# Patient Record
Sex: Male | Born: 1980 | Race: White | Hispanic: No | Smoking: Never smoker
Health system: Southern US, Community
[De-identification: ages and names within clinical notes are randomized; demographics above are authoritative.]

## PROBLEM LIST (undated history)

## (undated) DIAGNOSIS — G8929 Other chronic pain: Secondary | ICD-10-CM

## (undated) DIAGNOSIS — R51 Headache: Secondary | ICD-10-CM

## (undated) HISTORY — PX: ANTERIOR CRUCIATE LIGAMENT REPAIR: SHX115

## (undated) HISTORY — PX: OTHER SURGICAL HISTORY: SHX169

---

## 2010-10-12 ENCOUNTER — Ambulatory Visit (HOSPITAL_COMMUNITY)
Admission: RE | Admit: 2010-10-12 | Discharge: 2010-10-13 | Payer: Self-pay | Source: Home / Self Care | Admitting: Neurosurgery

## 2011-01-24 LAB — CBC
MCH: 28.5 pg (ref 26.0–34.0)
MCV: 83.2 fL (ref 78.0–100.0)
Platelets: 233 10*3/uL (ref 150–400)
RBC: 5.29 MIL/uL (ref 4.22–5.81)
RDW: 12.6 % (ref 11.5–15.5)
WBC: 6.8 10*3/uL (ref 4.0–10.5)

## 2011-01-24 LAB — SURGICAL PCR SCREEN: Staphylococcus aureus: POSITIVE — AB

## 2011-05-17 IMAGING — CR DG LUMBAR SPINE 2-3V
1 series · 1 of 1 positions shown · non-contrast
Comparison: None.

CLINICAL DATA: 28-year-old male undergoing lumbar spine surgery.

LUMBAR SPINE - 2-3 VIEW

[view not recorded]
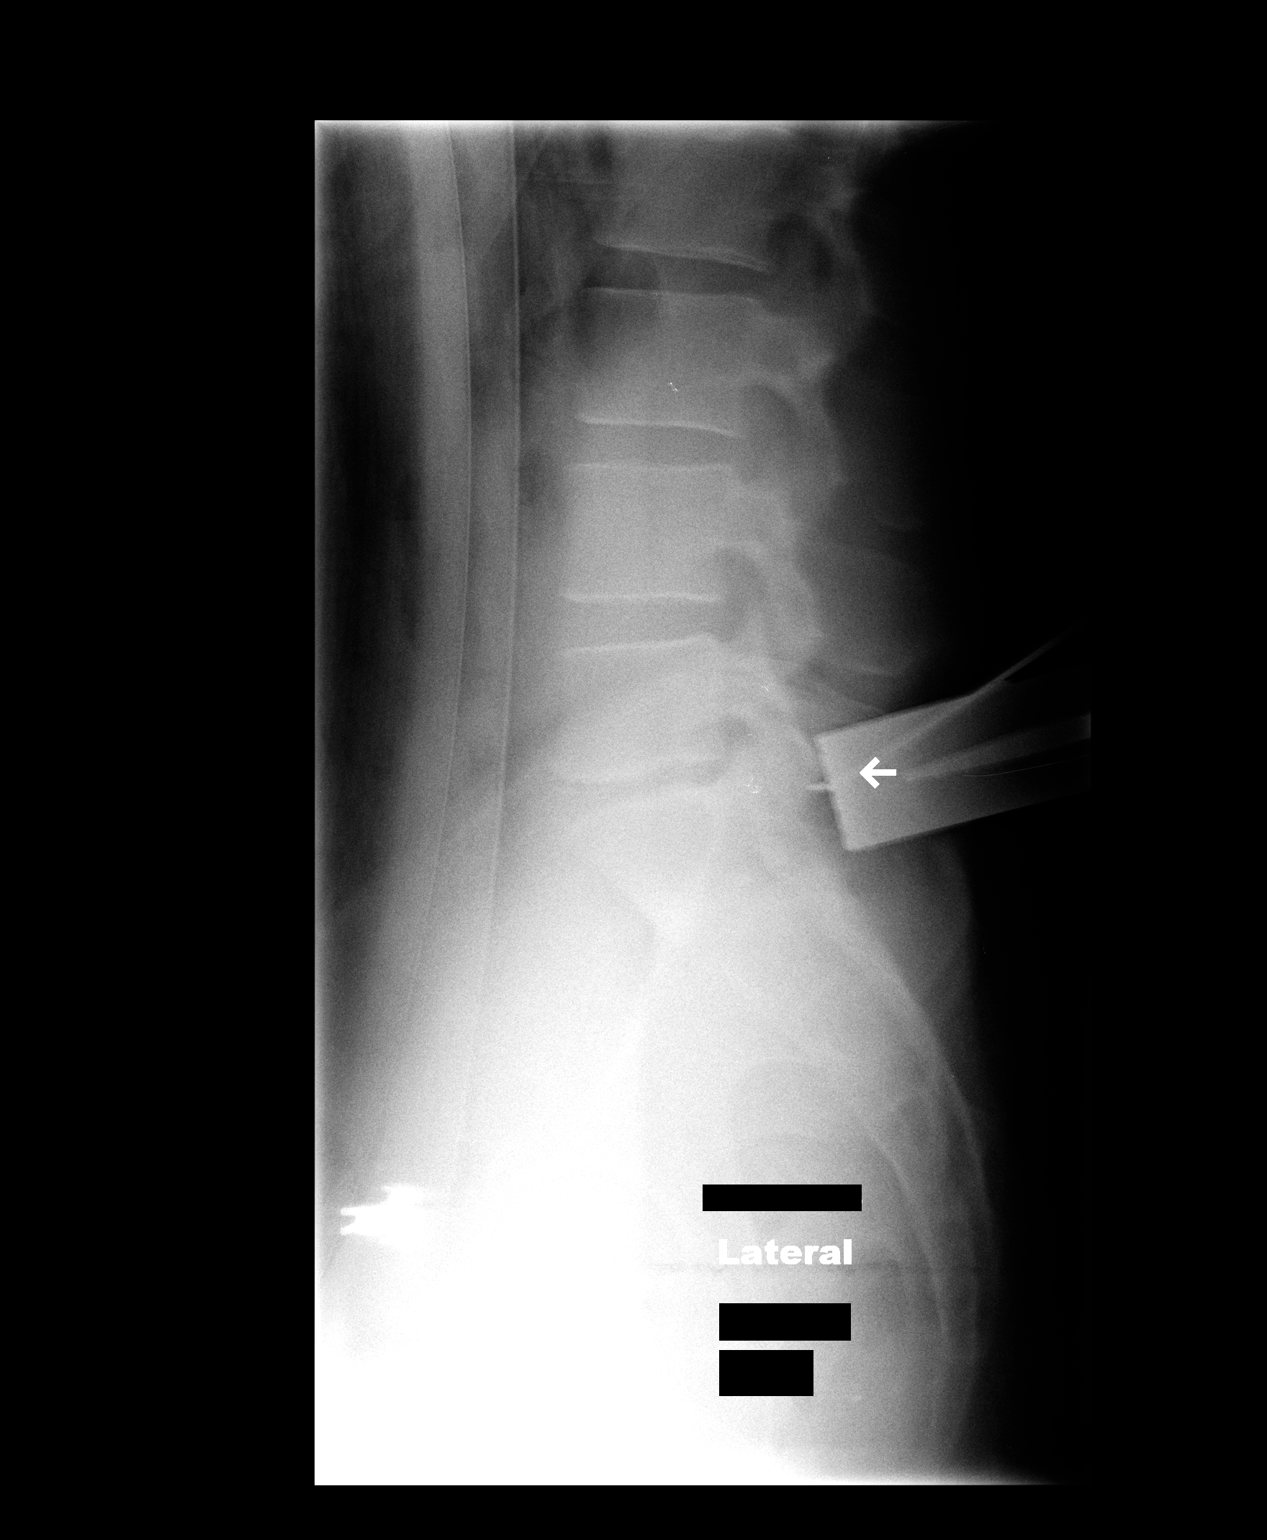

[1 of 1 positions shown; findings below may reference images not displayed]

FINDINGS: Normal lumbar segmentation is assumed on these images.

2 intraoperative portable cross-table lateral views of lumbar
spine.

Film #1 8388 hours.  Posterior spinal needle or surgical probe is
directed at the lower S1 level.

Film #2 at 6076 hours.  A surgical probe is directed toward the L5-
S1 disc space, projecting adjacent to the inferior L5 articulating
facet.
IMPRESSION: Intraoperative localization as above.

## 2014-12-28 ENCOUNTER — Emergency Department (HOSPITAL_BASED_OUTPATIENT_CLINIC_OR_DEPARTMENT_OTHER)
Admission: EM | Admit: 2014-12-28 | Discharge: 2014-12-28 | Disposition: A | Discharge status: Home / Self Care | Payer: Managed Care, Other (non HMO) | Attending: Emergency Medicine | Admitting: Emergency Medicine

## 2014-12-28 ENCOUNTER — Emergency Department (HOSPITAL_BASED_OUTPATIENT_CLINIC_OR_DEPARTMENT_OTHER): Payer: Managed Care, Other (non HMO)

## 2014-12-28 ENCOUNTER — Encounter (HOSPITAL_BASED_OUTPATIENT_CLINIC_OR_DEPARTMENT_OTHER): Payer: Self-pay | Admitting: *Deleted

## 2014-12-28 DIAGNOSIS — N492 Inflammatory disorders of scrotum: Secondary | ICD-10-CM | POA: Insufficient documentation

## 2014-12-28 MED ORDER — CLINDAMYCIN HCL 150 MG PO CAPS: 300.0000 mg | ORAL_CAPSULE | Freq: Three times a day (TID) | ORAL | Status: AC

## 2014-12-28 MED ORDER — CLINDAMYCIN PHOSPHATE 600 MG/50ML IV SOLN
600.0000 mg | Freq: Once | INTRAVENOUS | Status: AC
  Administered 2014-12-28: 600 mg via INTRAVENOUS
  Filled 2014-12-28: qty 50

## 2014-12-28 MED ORDER — HYDROMORPHONE HCL 1 MG/ML IJ SOLN
1.0000 mg | Freq: Once | INTRAMUSCULAR | Status: AC
  Administered 2014-12-28: 1 mg via INTRAVENOUS
  Filled 2014-12-28: qty 1

## 2014-12-28 MED ORDER — HYDROCODONE-ACETAMINOPHEN 10-325 MG PO TABS: 1.0000 | ORAL_TABLET | Freq: Four times a day (QID) | ORAL | Status: DC | PRN

## 2014-12-28 NOTE — ED Notes (Signed)
Pt. Reports on Sunday he felt pain and Saw Drainage from the Scrotum on yesterday.  Pt. Reports he has had some bleeding today in the area with lots of swelling and tenderness in the area of the lower scrotal sac at the cellulitis.  [facility]

## 2014-12-28 NOTE — ED Provider Notes (Signed)
CSN: [id number]     Arrival date & time 12/28/14  1943 History  This chart was scribed for Jeffrey Munchobert Febe Champa, MD by Freida Busmaniana Omoyeni, ED Scribe. This patient was seen in room MH03/MH03 and the patient's care was started 9:35 PM.    Chief Complaint  Patient presents with  . Cellulitis    The history is provided by the patient. No language interpreter was used.     HPI Comments:  Jeffrey Vaughn is a 34 y.o. male who presents to the Emergency Department complaining of mild-moderate testicular pain and swelling which started 3 days ago. He reports associated testicular discharge yesterday and blood from the site this am. He also reports subjective fever; states he took huis temp 2 days ago and it was 99.5 but did not measure his temp yesterday. [facility] for his symptoms and advised to come to the ED to have an ultrasound. He denies vomiting, confusion, and disorientation. No alleviating factors noted.   History reviewed. No pertinent past medical history. History reviewed. No pertinent past surgical history. No family history on file. History  Substance Use Topics  . Smoking status: Never Smoker   . Smokeless tobacco: Not on file  . Alcohol Use: No    Review of Systems  Constitutional: Positive for fever.  Gastrointestinal: Negative for nausea and vomiting.  Genitourinary: Positive for scrotal swelling and testicular pain.  Psychiatric/Behavioral: Negative for confusion.  All other systems reviewed and are negative.     Allergies  Review of patient's allergies indicates no known allergies.  Home Medications   Prior to Admission medications   Not on File   BP 112/97 mmHg  Pulse 118  Temp(Src) 98.5 F (36.9 C) (Oral)  Resp 20  Ht 6\' 1"  (1.854 m)  Wt 224 lb (101.606 kg)  BMI 29.56 kg/m2  SpO2 99% Physical Exam  Constitutional: He is oriented to person, place, and time. He appears well-developed. No distress.  HENT:  Head: Normocephalic  and atraumatic.  Eyes: Conjunctivae and EOM are normal.  Cardiovascular: Normal rate and regular rhythm.   Pulmonary/Chest: Effort normal. No stridor. No respiratory distress.  Abdominal: He exhibits no distension.  Genitourinary: Penis normal. Circumcised.  Firmness right hemi scrotum  Palpable lesion 1 cm mid scrotum. No active bleeding or drainage.  No pain w palp of either testes  Chaperone (scribe) was present for exam which was performed with no discomfort or complications.    Musculoskeletal: He exhibits no edema.  Neurological: He is alert and oriented to person, place, and time.  Skin: Skin is warm and dry.  Psychiatric: He has a normal mood and affect.  Nursing note and vitals reviewed.   ED Course  Procedures   DIAGNOSTIC STUDIES:  Oxygen Saturation is 99% on RA, normal by my interpretation.    COORDINATION OF CARE:  9:41 PM Will order scrotal US and pain meds. Discussed treatment plan with pt at bedside and pt agreed to plan.   Imaging Review [place] Scrotum  12/28/2014   CLINICAL DATA:  Cellulitis and mass in the posterior mid scrotum.  EXAM: ULTRASOUND OF SCROTUM  TECHNIQUE: Complete ultrasound examination of the testicles, epididymis, and other scrotal structures was performed.  COMPARISON:  None.  FINDINGS: Right testicle  Measurements: 2.0 x 2.6 x 4.3 cm. No mass or microlithiasis visualized.  Left testicle  Measurements: 2.1 x 2.5 x 4.3 cm. No mass or microlithiasis visualized.  Right epididymis:  Normal in size and appearance.  Left epididymis:  Normal in size and appearance.  Hydrocele:  None visualized.  Varicocele:  None visualized.  In the posterior mid scrotum there is focal skin thickening, hyperemic on color Doppler. There is an adjacent 1.4 x 2.7 by 1.4 cm hypoechoic collection which likely represents a small posterior midline scrotal abscess.  IMPRESSION: 1. Normal testes. 2. Posterior scrotal skin thickening and focal collection, likely an abscess measuring  1.4 x 1.4 x 2.7 cm.   Electronically Signed   By: Ellery Plunk M.D.   On: 12/28/2014 23:10    On repeat exam the patient is calm.  I discussed all findings with him and his wife. No new complaints.  They voice understanding of all results.  They will follow up with urology. MDM   Final diagnoses:  Scrotal abscess   patient presents with concern of ongoing scrotal pain. Patient had a scrotal abscess. No evidence for sepsis bacteremia. The lesion is draining intermittently, spontaneously.  Patient was counseled on home care instructions, will follow-up with urology. He received initial and a box in the IV, was transitioned to oral antibiotic, discharged in stable condition.  I personally performed the services described in this documentation, which was scribed in my presence. The recorded information has been reviewed and is accurate.      Jeffrey Munch, MD 12/29/14 Marlyne Beards

## 2014-12-28 NOTE — Discharge Instructions (Signed)
As discussed, you have a scrotal abscess.  It is important that you keep the area clean, dry.  Please take all medication as directed, follow up with our urologist for further evaluation and management.  Return here for concerning changes in your condition.

## 2015-05-18 ENCOUNTER — Encounter (HOSPITAL_BASED_OUTPATIENT_CLINIC_OR_DEPARTMENT_OTHER): Payer: Self-pay | Admitting: Emergency Medicine

## 2015-05-18 ENCOUNTER — Emergency Department (HOSPITAL_BASED_OUTPATIENT_CLINIC_OR_DEPARTMENT_OTHER)
Admission: EM | Admit: 2015-05-18 | Discharge: 2015-05-18 | Disposition: A | Discharge status: Home / Self Care | Payer: Managed Care, Other (non HMO) | Attending: Emergency Medicine | Admitting: Emergency Medicine

## 2015-05-18 ENCOUNTER — Emergency Department (HOSPITAL_BASED_OUTPATIENT_CLINIC_OR_DEPARTMENT_OTHER)
Admission: EM | Admit: 2015-05-18 | Discharge: 2015-05-19 | Disposition: A | Discharge status: Home / Self Care | Payer: Managed Care, Other (non HMO) | Attending: Emergency Medicine | Admitting: Emergency Medicine

## 2015-05-18 DIAGNOSIS — G43909 Migraine, unspecified, not intractable, without status migrainosus: Secondary | ICD-10-CM | POA: Insufficient documentation

## 2015-05-18 DIAGNOSIS — M545 Low back pain: Secondary | ICD-10-CM | POA: Insufficient documentation

## 2015-05-18 DIAGNOSIS — D72829 Elevated white blood cell count, unspecified: Secondary | ICD-10-CM | POA: Insufficient documentation

## 2015-05-18 DIAGNOSIS — N390 Urinary tract infection, site not specified: Secondary | ICD-10-CM | POA: Insufficient documentation

## 2015-05-18 DIAGNOSIS — R509 Fever, unspecified: Secondary | ICD-10-CM | POA: Diagnosis present

## 2015-05-18 DIAGNOSIS — R51 Headache: Secondary | ICD-10-CM | POA: Diagnosis present

## 2015-05-18 DIAGNOSIS — G43009 Migraine without aura, not intractable, without status migrainosus: Secondary | ICD-10-CM

## 2015-05-18 LAB — CBC WITH DIFFERENTIAL/PLATELET
BASOS PCT: 0 % (ref 0–1)
Basophils Absolute: 0 10*3/uL (ref 0.0–0.1)
Basophils Absolute: 0 10*3/uL (ref 0.0–0.1)
Basophils Relative: 0 % (ref 0–1)
EOS ABS: 0 10*3/uL (ref 0.0–0.7)
EOS PCT: 0 % (ref 0–5)
Eosinophils Absolute: 0 10*3/uL (ref 0.0–0.7)
Eosinophils Relative: 0 % (ref 0–5)
HCT: 41.8 % (ref 39.0–52.0)
HCT: 42.9 % (ref 39.0–52.0)
HEMOGLOBIN: 13.8 g/dL (ref 13.0–17.0)
Hemoglobin: 14.3 g/dL (ref 13.0–17.0)
LYMPHS ABS: 0.5 10*3/uL — AB (ref 0.7–4.0)
LYMPHS ABS: 0.5 10*3/uL — AB (ref 0.7–4.0)
LYMPHS PCT: 3 % — AB (ref 12–46)
Lymphocytes Relative: 2 % — ABNORMAL LOW (ref 12–46)
MCH: 27 pg (ref 26.0–34.0)
MCH: 27.2 pg (ref 26.0–34.0)
MCHC: 33 g/dL (ref 30.0–36.0)
MCHC: 33.3 g/dL (ref 30.0–36.0)
MCV: 80.9 fL (ref 78.0–100.0)
MCV: 82.4 fL (ref 78.0–100.0)
MONOS PCT: 4 % (ref 3–12)
Monocytes Absolute: 0.9 10*3/uL (ref 0.1–1.0)
Monocytes Absolute: 1.5 10*3/uL — ABNORMAL HIGH (ref 0.1–1.0)
Monocytes Relative: 6 % (ref 3–12)
NEUTROS PCT: 92 % — AB (ref 43–77)
NEUTROS PCT: 93 % — AB (ref 43–77)
Neutro Abs: 19 10*3/uL — ABNORMAL HIGH (ref 1.7–7.7)
Neutro Abs: 21.6 10*3/uL — ABNORMAL HIGH (ref 1.7–7.7)
PLATELETS: 145 10*3/uL — AB (ref 150–400)
PLATELETS: 132 10*3/uL — AB (ref 150–400)
RBC: 5.3 MIL/uL (ref 4.22–5.81)
RBC: 5.07 MIL/uL (ref 4.22–5.81)
RDW: 15.1 % (ref 11.5–15.5)
RDW: 15.3 % (ref 11.5–15.5)
WBC: 23.6 10*3/uL — ABNORMAL HIGH (ref 4.0–10.5)
WBC: 20.5 10*3/uL — AB (ref 4.0–10.5)

## 2015-05-18 LAB — URINALYSIS, ROUTINE W REFLEX MICROSCOPIC
Bilirubin Urine: NEGATIVE
Glucose, UA: NEGATIVE mg/dL
Ketones, ur: NEGATIVE mg/dL
NITRITE: POSITIVE — AB
PROTEIN: 100 mg/dL — AB
Specific Gravity, Urine: 1.025 (ref 1.005–1.030)
UROBILINOGEN UA: 1 mg/dL (ref 0.0–1.0)
pH: 7.5 (ref 5.0–8.0)

## 2015-05-18 LAB — URINE MICROSCOPIC-ADD ON

## 2015-05-18 LAB — COMPREHENSIVE METABOLIC PANEL
ALK PHOS: 39 U/L (ref 38–126)
ALT: 28 U/L (ref 17–63)
ANION GAP: 11 (ref 5–15)
AST: 21 U/L (ref 15–41)
Albumin: 3.7 g/dL (ref 3.5–5.0)
BILIRUBIN TOTAL: 1.1 mg/dL (ref 0.3–1.2)
BUN: 11 mg/dL (ref 6–20)
CHLORIDE: 102 mmol/L (ref 101–111)
CO2: 24 mmol/L (ref 22–32)
Calcium: 8.8 mg/dL — ABNORMAL LOW (ref 8.9–10.3)
Creatinine, Ser: 1.01 mg/dL (ref 0.61–1.24)
GLUCOSE: 124 mg/dL — AB (ref 65–99)
POTASSIUM: 3.8 mmol/L (ref 3.5–5.1)
SODIUM: 137 mmol/L (ref 135–145)
TOTAL PROTEIN: 6.4 g/dL — AB (ref 6.5–8.1)

## 2015-05-18 LAB — I-STAT CG4 LACTIC ACID, ED
LACTIC ACID, VENOUS: 0.86 mmol/L (ref 0.5–2.0)
Lactic Acid, Venous: 0.76 mmol/L (ref 0.5–2.0)

## 2015-05-18 MED ORDER — CEFTRIAXONE SODIUM 1 G IJ SOLR: 1.0000 g | Freq: Once | INTRAMUSCULAR | Status: AC

## 2015-05-18 MED ORDER — CEPHALEXIN 500 MG PO CAPS: 500.0000 mg | ORAL_CAPSULE | Freq: Four times a day (QID) | ORAL | Status: AC

## 2015-05-18 MED ORDER — KETOROLAC TROMETHAMINE 30 MG/ML IJ SOLN
30.0000 mg | Freq: Once | INTRAMUSCULAR | Status: AC
  Administered 2015-05-18: 30 mg via INTRAVENOUS
  Filled 2015-05-18: qty 1

## 2015-05-18 MED ORDER — HYDROCODONE-ACETAMINOPHEN 5-325 MG PO TABS
1.0000 | ORAL_TABLET | ORAL | Status: AC | PRN
Start: 2015-05-18 — End: ?

## 2015-05-18 MED ORDER — CEFTRIAXONE SODIUM 1 G IJ SOLR
INTRAMUSCULAR | Status: AC
  Administered 2015-05-18: 1000 mg
  Filled 2015-05-18: qty 10

## 2015-05-18 MED ORDER — DIPHENHYDRAMINE HCL 50 MG/ML IJ SOLN
50.0000 mg | Freq: Once | INTRAMUSCULAR | Status: AC
  Administered 2015-05-18: 50 mg via INTRAVENOUS
  Filled 2015-05-18: qty 1

## 2015-05-18 MED ORDER — SODIUM CHLORIDE 0.9 % IV BOLUS (SEPSIS)
1000.0000 mL | Freq: Once | INTRAVENOUS | Status: AC
  Administered 2015-05-18: 1000 mL via INTRAVENOUS

## 2015-05-18 MED ORDER — ACETAMINOPHEN 500 MG PO TABS
1000.0000 mg | ORAL_TABLET | Freq: Once | ORAL | Status: AC
  Administered 2015-05-18: 1000 mg via ORAL
  Filled 2015-05-18: qty 2

## 2015-05-18 MED ORDER — METOCLOPRAMIDE HCL 5 MG/ML IJ SOLN
10.0000 mg | Freq: Once | INTRAMUSCULAR | Status: AC
  Administered 2015-05-18: 10 mg via INTRAVENOUS
  Filled 2015-05-18: qty 2

## 2015-05-18 MED ORDER — ONDANSETRON HCL 4 MG/2ML IJ SOLN
4.0000 mg | Freq: Once | INTRAMUSCULAR | Status: DC
Start: 2015-05-18 — End: 2015-05-18

## 2015-05-18 MED ORDER — IBUPROFEN 600 MG PO TABS: 600.0000 mg | ORAL_TABLET | Freq: Four times a day (QID) | ORAL | Status: AC | PRN

## 2015-05-18 NOTE — ED Provider Notes (Signed)
TIME SEEN: 11:25 PM  CHIEF COMPLAINT: HA and Emesis  HPI:  Jeffrey Vaughn is a 34 y.o. male with history of migraines who presents to the Emergency Department complaining of HA onset today. Pt has a hx of migraines and this current HA feels similar to his migraines. Pt was seen in the ED last night and dx with an UTI, pt has began taking his abx Rx. Pt notes that he has had a HA all day today and that his urinary symptoms have subsided. He states that he is having associated symptoms of vomiting x 2 today. Pt last vomited 2 hours ago. He states that he has tried ibuprofen and hydrocodone with no relief for his symptoms. He denies hematuria, dysuria, fever, cough, rash, and any other symptoms. No abdominal pain or back pain. Pt denies hx of kidney stones or previous kidney infection. States last fever was earlier this morning. Not on anticoagulation. No history of head injury. No numbness, tingling or focal weakness. Headache worse with lights and sounds. Described as moderate to severe, gradual onset. No radiation of pain.   ROS: See HPI Constitutional:  fever  Eyes: no drainage  ENT: no runny nose   Cardiovascular:  no chest pain  Resp: no SOB  GI:  vomiting GU: no dysuria Integumentary: no rash  Allergy: no hives  Musculoskeletal: no leg swelling  Neurological: no slurred speech ROS otherwise negative  PAST MEDICAL HISTORY/PAST SURGICAL HISTORY:  History reviewed. No pertinent past medical history.  MEDICATIONS:  Prior to Admission medications   Medication Sig Start Date End Date Taking? Authorizing Provider  cephALEXin (KEFLEX) 500 MG capsule Take 1 capsule (500 mg total) by mouth 4 (four) times daily. 05/18/15  Yes [name], MD  HYDROcodone-acetaminophen (NORCO) 5-325 MG per tablet Take 1-2 tablets by mouth every 4 (four) hours as needed for moderate pain or severe pain. 05/18/15  Yes [name], MD  ibuprofen (ADVIL,MOTRIN) 600 MG tablet Take 1 tablet (600 mg total) by  mouth every 6 (six) hours as needed. 05/18/15  Yes [name], MD    ALLERGIES:  No Known Allergies  SOCIAL HISTORY:  History  Substance Use Topics  . Smoking status: Never Smoker   . Smokeless tobacco: Not on file  . Alcohol Use: No    FAMILY HISTORY: No family history on file.  EXAM: BP 127/59 mmHg  Pulse 81  Temp(Src) 98.3 F (36.8 C) (Oral)  Resp 18  Ht  (1.854 m)  Wt 200 lb (90.719 kg)  BMI 26.39 kg/m2  SpO2 100% CONSTITUTIONAL: Alert and oriented and responds appropriately to questions. Well-appearing; well-nourished, nontoxic, afebrile HEAD: Normocephalic EYES: Conjunctivae clear, PERRL, photophobia ENT: normal nose; no rhinorrhea; moist mucous membranes; pharynx without lesions noted NECK: Supple, no meningismus, no LAD  CARD: RRR; S1 and S2 appreciated; no murmurs, no clicks, no rubs, no gallops RESP: Normal chest excursion without splinting or tachypnea; breath sounds clear and equal bilaterally; no wheezes, no rhonchi, no rales, no hypoxia or respiratory distress, speaking full sentences ABD/GI: Normal bowel sounds; non-distended; soft, non-tender, no rebound, no guarding, no peritoneal signs BACK:  The back appears normal and is non-tender to palpation, there is no CVA tenderness EXT: Normal ROM in all joints; non-tender to palpation; no edema; normal capillary refill; no cyanosis, no calf tenderness or swelling    SKIN: Normal color for age and race; warm NEURO: Moves all extremities equally, sensation to light touch intact diffusely, cranial nerves II through XII intact PSYCH:  The patient's mood and manner are appropriate. Grooming and personal hygiene are appropriate.  MEDICAL DECISION MAKING: Patient here with migraine headache. He was also here last night for urinary tract infection, possible pyelonephritis. Had a leukocytosis of 23,000 but otherwise labs including lactate were normal. Urine did show nitrite-positive UTI and he was given ceftriaxone  in the ED. Patient reports he was feeling much better when he left yesterday. He was discharged on Keflex which he's been taking. States his last fever was 12 hours ago. States his headache feels like his prior migraines. No new neurologic deficit. No meningismus on exam. Treat headache with Toradol, Reglan, Benadryl and IV fluids. We'll repeat labs, lactate and urine given patient could have pyelonephritis to ensure things are improving and that is not the cause of his vomiting today. He appears well hydrated on exam. Abdominal exam is benign.  ED PROGRESS: Patient's labs are improving. His white blood cell count is now 20,000. His urine shows moderate leukocytes and bacteria but no longer shows nitrites. Lactate is still normal 0.76. He reports his headache is completely gone after migraine cocktail and he is feeling much better. Have given him another dose of ceftriaxone in the emergency department and have recommended he continue his Keflex. He states he would like to be discharged home. He was discharged with ibuprofen, Vicodin as well for pain. We'll discharge him with Zofran for nausea and vomiting. He has not had any vomiting in the emergency department and is tolerating by mouth. Discussed return precautions. He verbalizes understanding is comfortable with this plan.   I personally performed the services described in this documentation, which was scribed in my presence. The recorded information has been reviewed and is accurate.   Layla MawKristen N Miyanna Wiersma, DO 05/19/15 [phone]

## 2015-05-18 NOTE — ED Provider Notes (Signed)
9:26 AM Care assumed from Dr. Ranae PalmsYelverton.  On reassessment, pt nontoxic, vitals improved, feeling somewhat better.  Appears stable for outpatient treatment.  Given abx and return precautions.   Clinical Impression: 1. UTI (lower urinary tract infection)   2. Leukocytosis       Jeffrey DivineJohn Hilda Rynders, MD 05/18/15 [phone]

## 2015-05-18 NOTE — ED Notes (Signed)
Pt here for a recheck of headache and vomiting symptoms. Pt recently d/c from here last night with dx of UTI and has started Antibiotics. 2 episodes of vomiting today.

## 2015-05-18 NOTE — ED Notes (Signed)
Patient reports that he has had fever and burning with urination x 24 hours.

## 2015-05-18 NOTE — ED Notes (Signed)
MD at bedside. 

## 2015-05-18 NOTE — ED Provider Notes (Signed)
CSN: [id number]     Arrival date & time 05/18/15  0423 History   First MD Initiated Contact with Patient 05/18/15 [phone]     Chief Complaint  Patient presents with  . Fever     (Consider location/radiation/quality/duration/timing/severity/associated sxs/prior Treatment) HPI Patient with 24 hours of fever. He states he's also had chills. He's been taking Advil with some relief. Patient has had burning with urination and frequent urination over the last 24 hours as well. Denies any penile discharge, testicular swelling or pain. He denies nasal congestion or sore throat. He's had no neck pain or stiffness. He has no cough or shortness of breath. Denies any abdominal pain, vomiting or diarrhea. No known sick contacts. History reviewed. No pertinent past medical history. History reviewed. No pertinent past surgical history. History reviewed. No pertinent family history. History  Substance Use Topics  . Smoking status: Never Smoker   . Smokeless tobacco: Not on file  . Alcohol Use: No    Review of Systems  Constitutional: Positive for fever and chills.  HENT: Negative for congestion, sinus pressure and sore throat.   Respiratory: Negative for cough and shortness of breath.   Cardiovascular: Negative for chest pain.  Gastrointestinal: Negative for nausea, vomiting, abdominal pain and diarrhea.  Genitourinary: Positive for dysuria. Negative for flank pain, discharge, scrotal swelling, penile pain and testicular pain.  Musculoskeletal: Negative for back pain, neck pain and neck stiffness.  Skin: Negative for rash.  Neurological: Negative for dizziness, weakness, light-headedness, numbness and headaches.  All other systems reviewed and are negative.     Allergies  Review of patient's allergies indicates no known allergies.  Home Medications   Prior to Admission medications   Medication Sig Start Date End Date Taking? Authorizing Provider  cephALEXin (KEFLEX) 500 MG capsule Take 1  capsule (500 mg total) by mouth 4 (four) times daily. 05/18/15   Loren Raceravid Adrick Kestler, MD  HYDROcodone-acetaminophen (NORCO) 5-325 MG per tablet Take 1-2 tablets by mouth every 4 (four) hours as needed for moderate pain or severe pain. 05/18/15   Loren Raceravid Ludmila Ebarb, MD  ibuprofen (ADVIL,MOTRIN) 600 MG tablet Take 1 tablet (600 mg total) by mouth every 6 (six) hours as needed. 05/18/15   Loren Raceravid Geniva Lohnes, MD  ondansetron (ZOFRAN ODT) 4 MG disintegrating tablet Take 1 tablet (4 mg total) by mouth every 8 (eight) hours as needed for nausea or vomiting. 05/19/15   Kristen N Ward, DO   BP 105/54 mmHg  Pulse 84  Temp(Src) 98 F (36.7 C) (Oral)  Resp 18  Ht 6\' 1"  (1.854 m)  Wt 200 lb (90.719 kg)  BMI 26.39 kg/m2  SpO2 100% Physical Exam  Constitutional: He is oriented to person, place, and time. He appears well-developed and well-nourished. No distress.  HENT:  Head: Normocephalic and atraumatic.  Mouth/Throat: Oropharynx is clear and moist. No oropharyngeal exudate.  Eyes: EOM are normal. Pupils are equal, round, and reactive to light.  Neck: Normal range of motion. Neck supple.  No meningismus  Cardiovascular: Normal rate and regular rhythm.   Pulmonary/Chest: Effort normal and breath sounds normal. No respiratory distress. He has no wheezes. He has no rales. He exhibits no tenderness.  Abdominal: Soft. Bowel sounds are normal. He exhibits no distension and no mass. There is no tenderness. There is no rebound and no guarding.  Genitourinary:  No testicular tenderness or masses. No penile discharge.  Musculoskeletal: Normal range of motion. He exhibits no edema or tenderness.  No CVA tenderness.  Neurological: He is alert  and oriented to person, place, and time.  Moves all extremities without deficit. Sensation grossly intact.  Skin: Skin is warm and dry. No rash noted. No erythema.  Psychiatric: He has a normal mood and affect. His behavior is normal.  Nursing note and vitals reviewed.   ED Course   Procedures (including critical care time) Labs Review Labs Reviewed  URINALYSIS, ROUTINE W REFLEX MICROSCOPIC (NOT AT [facility]) - Abnormal; Notable for the following:    Color, Urine AMBER (*)    APPearance CLOUDY (*)    Hgb urine dipstick MODERATE (*)    Protein, ur 100 (*)    Nitrite POSITIVE (*)    Leukocytes, UA LARGE (*)    All other components within normal limits  URINE MICROSCOPIC-ADD ON - Abnormal; Notable for the following:    Bacteria, UA MANY (*)    All other components within normal limits  CBC WITH DIFFERENTIAL/PLATELET - Abnormal; Notable for the following:    WBC 23.6 (*)    Platelets 145 (*)    Neutrophils Relative % 92 (*)    Neutro Abs 21.6 (*)    Lymphocytes Relative 2 (*)    Lymphs Abs 0.5 (*)    Monocytes Absolute 1.5 (*)    All other components within normal limits  COMPREHENSIVE METABOLIC PANEL - Abnormal; Notable for the following:    Glucose, Bld 124 (*)    Calcium 8.8 (*)    Total Protein 6.4 (*)    All other components within normal limits  CULTURE, BLOOD (ROUTINE X 2)  CULTURE, BLOOD (ROUTINE X 2)  I-STAT CG4 LACTIC ACID, ED  GC/CHLAMYDIA PROBE AMP ([place]) NOT AT [facility]    Imaging Review No results found.   EKG Interpretation None      MDM   Final diagnoses:  UTI (lower urinary tract infection)  Leukocytosis   Though initially appeared well patient became diaphoretic. Complaining of mild low back pain. Given IV Rocephin and 1 L normal saline. Improved temperature and heart rate though blood pressure now borderline low. Concern for possible early sepsis/pyelonephritis. Patient will be given another liter IV fluid. We'll check basic blood work, lactic acid and draw blood cultures.    Patient signed out to oncoming emergency physician pending reevaluation  [name], MD 05/19/15 [phone]

## 2015-05-18 NOTE — Discharge Instructions (Signed)
Call and make an appointment to follow-up with the urologist. Return immediately for worsening pain, fever or for any concerns.  Urinary Tract Infection Urinary tract infections (UTIs) can develop anywhere along your urinary tract. Your urinary tract is your body's drainage system for removing wastes and extra water. Your urinary tract includes two kidneys, two ureters, a bladder, and a urethra. Your kidneys are a pair of bean-shaped organs. Each kidney is about the size of your fist. They are located below your ribs, one on each side of your spine. CAUSES Infections are caused by microbes, which are microscopic organisms, including fungi, viruses, and bacteria. These organisms are so small that they can only be seen through a microscope. Bacteria are the microbes that most commonly cause UTIs. SYMPTOMS  Symptoms of UTIs may vary by age and gender of the patient and by the location of the infection. Symptoms in young women typically include a frequent and intense urge to urinate and a painful, burning feeling in the bladder or urethra during urination. Older women and men are more likely to be tired, shaky, and weak and have muscle aches and abdominal pain. A fever may mean the infection is in your kidneys. Other symptoms of a kidney infection include pain in your back or sides below the ribs, nausea, and vomiting. DIAGNOSIS To diagnose a UTI, your caregiver will ask you about your symptoms. Your caregiver also will ask to provide a urine sample. The urine sample will be tested for bacteria and white blood cells. White blood cells are made by your body to help fight infection. TREATMENT  Typically, UTIs can be treated with medication. Because most UTIs are caused by a bacterial infection, they usually can be treated with the use of antibiotics. The choice of antibiotic and length of treatment depend on your symptoms and the type of bacteria causing your infection. HOME CARE INSTRUCTIONS  If you were  prescribed antibiotics, take them exactly as your caregiver instructs you. Finish the medication even if you feel better after you have only taken some of the medication.  Drink enough water and fluids to keep your urine clear or pale yellow.  Avoid caffeine, tea, and carbonated beverages. They tend to irritate your bladder.  Empty your bladder often. Avoid holding urine for long periods of time.  Empty your bladder before and after sexual intercourse.  After a bowel movement, women should cleanse from front to back. Use each tissue only once. SEEK MEDICAL CARE IF:   You have back pain.  You develop a fever.  Your symptoms do not begin to resolve within 3 days. SEEK IMMEDIATE MEDICAL CARE IF:   You have severe back pain or lower abdominal pain.  You develop chills.  You have nausea or vomiting.  You have continued burning or discomfort with urination. MAKE SURE YOU:   Understand these instructions.  Will watch your condition.  Will get help right away if you are not doing well or get worse. Document Released: 08/08/2005 Document Revised: 04/29/2012 Document Reviewed: 12/07/2011 [facility] Patient Information 2015 [employer] [place]. This information is not intended to replace advice given to you by your health care provider. Make sure you discuss any questions you have with your health care provider.

## 2015-05-19 LAB — URINALYSIS, ROUTINE W REFLEX MICROSCOPIC
Bilirubin Urine: NEGATIVE
GLUCOSE, UA: NEGATIVE mg/dL
HGB URINE DIPSTICK: NEGATIVE
Ketones, ur: 15 mg/dL — AB
Nitrite: NEGATIVE
PROTEIN: NEGATIVE mg/dL
SPECIFIC GRAVITY, URINE: 1.022 (ref 1.005–1.030)
Urobilinogen, UA: 1 mg/dL (ref 0.0–1.0)
pH: 6.5 (ref 5.0–8.0)

## 2015-05-19 LAB — COMPREHENSIVE METABOLIC PANEL
ALBUMIN: 3.6 g/dL (ref 3.5–5.0)
ALK PHOS: 39 U/L (ref 38–126)
ALT: 26 U/L (ref 17–63)
AST: 18 U/L (ref 15–41)
Anion gap: 8 (ref 5–15)
BUN: 12 mg/dL (ref 6–20)
CHLORIDE: 102 mmol/L (ref 101–111)
CO2: 26 mmol/L (ref 22–32)
Calcium: 8.3 mg/dL — ABNORMAL LOW (ref 8.9–10.3)
Creatinine, Ser: 0.93 mg/dL (ref 0.61–1.24)
GFR calc Af Amer: >60 mL/min (ref >60)
GFR calc non Af Amer: >60 mL/min (ref >60)
Glucose, Bld: 128 mg/dL — ABNORMAL HIGH (ref 65–99)
POTASSIUM: 3.8 mmol/L (ref 3.5–5.1)
SODIUM: 136 mmol/L (ref 135–145)
TOTAL PROTEIN: 6.5 g/dL (ref 6.5–8.1)
Total Bilirubin: 0.6 mg/dL (ref 0.3–1.2)

## 2015-05-19 LAB — URINE MICROSCOPIC-ADD ON

## 2015-05-19 LAB — GC/CHLAMYDIA PROBE AMP (~~LOC~~) NOT AT ARMC
Chlamydia: NEGATIVE
Neisseria Gonorrhea: NEGATIVE

## 2015-05-19 MED ORDER — DEXTROSE 5 % IV SOLN: 1.0000 g | Freq: Once | INTRAVENOUS | Status: AC

## 2015-05-19 MED ORDER — CEFTRIAXONE SODIUM 1 G IJ SOLR
INTRAMUSCULAR | Status: AC
  Administered 2015-05-19: 1000 mg
  Filled 2015-05-19: qty 10

## 2015-05-19 MED ORDER — ONDANSETRON 4 MG PO TBDP: 4.0000 mg | ORAL_TABLET | Freq: Three times a day (TID) | ORAL | Status: AC | PRN

## 2015-05-19 NOTE — Discharge Instructions (Signed)
Migraine Headache A migraine headache is an intense, throbbing pain on one or both sides of your head. A migraine can last for 30 minutes to several hours. CAUSES  The exact cause of a migraine headache is not always known. However, a migraine may be caused when nerves in the brain become irritated and release chemicals that cause inflammation. This causes pain. Certain things may also trigger migraines, such as:  Alcohol.  Smoking.  Stress.  Menstruation.  Aged cheeses.  Foods or drinks that contain nitrates, glutamate, aspartame, or tyramine.  Lack of sleep.  Chocolate.  Caffeine.  Hunger.  Physical exertion.  Fatigue.  Medicines used to treat chest pain (nitroglycerine), birth control pills, estrogen, and some blood pressure medicines. SIGNS AND SYMPTOMS  Pain on one or both sides of your head.  Pulsating or throbbing pain.  Severe pain that prevents daily activities.  Pain that is aggravated by any physical activity.  Nausea, vomiting, or both.  Dizziness.  Pain with exposure to bright lights, loud noises, or activity.  General sensitivity to bright lights, loud noises, or smells. Before you get a migraine, you may get warning signs that a migraine is coming (aura). An aura may include:  Seeing flashing lights.  Seeing bright spots, halos, or zigzag lines.  Having tunnel vision or blurred vision.  Having feelings of numbness or tingling.  Having trouble talking.  Having muscle weakness. DIAGNOSIS  A migraine headache is often diagnosed based on:  Symptoms.  Physical exam.  A CT scan or MRI of your head. These imaging tests cannot diagnose migraines, but they can help rule out other causes of headaches. TREATMENT Medicines may be given for pain and nausea. Medicines can also be given to help prevent recurrent migraines.  HOME CARE INSTRUCTIONS  Only take over-the-counter or prescription medicines for pain or discomfort as directed by your  health care provider. The use of long-term narcotics is not recommended.  Lie down in a dark, quiet room when you have a migraine.  Keep a journal to find out what may trigger your migraine headaches. For example, write down:  What you eat and drink.  How much sleep you get.  Any change to your diet or medicines.  Limit alcohol consumption.  Quit smoking if you smoke.  Get 7-9 hours of sleep, or as recommended by your health care provider.  Limit stress.  Keep lights dim if bright lights bother you and make your migraines worse. SEEK IMMEDIATE MEDICAL CARE IF:   Your migraine becomes severe.  You have a fever.  You have a stiff neck.  You have vision loss.  You have muscular weakness or loss of muscle control.  You start losing your balance or have trouble walking.  You feel faint or pass out.  You have severe symptoms that are different from your first symptoms. MAKE SURE YOU:   Understand these instructions.  Will watch your condition.  Will get help right away if you are not doing well or get worse. Document Released: 10/29/2005 Document Revised: 03/15/2014 Document Reviewed: 07/06/2013 [facility] Patient Information 2015 [place], [place]. This information is not intended to replace advice given to you by your health care provider. Make sure you discuss any questions you have with your health care provider.  Urinary Tract Infection Urinary tract infections (UTIs) can develop anywhere along your urinary tract. Your urinary tract is your body's drainage system for removing wastes and extra water. Your urinary tract includes two kidneys, two ureters, a bladder, and  a urethra. Your kidneys are a pair of bean-shaped organs. Each kidney is about the size of your fist. They are located below your ribs, one on each side of your spine. CAUSES Infections are caused by microbes, which are microscopic organisms, including fungi, viruses, and bacteria. These organisms are so  small that they can only be seen through a microscope. Bacteria are the microbes that most commonly cause UTIs. SYMPTOMS  Symptoms of UTIs may vary by age and gender of the patient and by the location of the infection. Symptoms in young women typically include a frequent and intense urge to urinate and a painful, burning feeling in the bladder or urethra during urination. Older women and men are more likely to be tired, shaky, and weak and have muscle aches and abdominal pain. A fever may mean the infection is in your kidneys. Other symptoms of a kidney infection include pain in your back or sides below the ribs, nausea, and vomiting. DIAGNOSIS To diagnose a UTI, your caregiver will ask you about your symptoms. Your caregiver also will ask to provide a urine sample. The urine sample will be tested for bacteria and white blood cells. White blood cells are made by your body to help fight infection. TREATMENT  Typically, UTIs can be treated with medication. Because most UTIs are caused by a bacterial infection, they usually can be treated with the use of antibiotics. The choice of antibiotic and length of treatment depend on your symptoms and the type of bacteria causing your infection. HOME CARE INSTRUCTIONS  If you were prescribed antibiotics, take them exactly as your caregiver instructs you. Finish the medication even if you feel better after you have only taken some of the medication.  Drink enough water and fluids to keep your urine clear or pale yellow.  Avoid caffeine, tea, and carbonated beverages. They tend to irritate your bladder.  Empty your bladder often. Avoid holding urine for long periods of time.  Empty your bladder before and after sexual intercourse.  After a bowel movement, women should cleanse from front to back. Use each tissue only once. SEEK MEDICAL CARE IF:   You have back pain.  You develop a fever.  Your symptoms do not begin to resolve within 3 days. SEEK  IMMEDIATE MEDICAL CARE IF:   You have severe back pain or lower abdominal pain.  You develop chills.  You have nausea or vomiting.  You have continued burning or discomfort with urination. MAKE SURE YOU:   Understand these instructions.  Will watch your condition.  Will get help right away if you are not doing well or get worse. Document Released: 08/08/2005 Document Revised: 04/29/2012 Document Reviewed: 12/07/2011 [facility] Patient Information 2015 [place], [place]. This information is not intended to replace advice given to you by your health care provider. Make sure you discuss any questions you have with your health care provider.

## 2015-05-23 LAB — CULTURE, BLOOD (ROUTINE X 2)
CULTURE: NO GROWTH
Culture: NO GROWTH

## 2015-08-02 IMAGING — US US SCROTUM
1 series · 14 of 25 positions shown · non-contrast
Comparison: None.

CLINICAL DATA: Cellulitis and mass in the posterior mid scrotum.

EXAM:
ULTRASOUND OF SCROTUM
TECHNIQUE: Complete ultrasound examination of the testicles, epididymis, and
other scrotal structures was performed.

[Series 1: us scrotum · 0.08mm/px · 38 acquisitions, 14 frames shown]
[im 1/38]
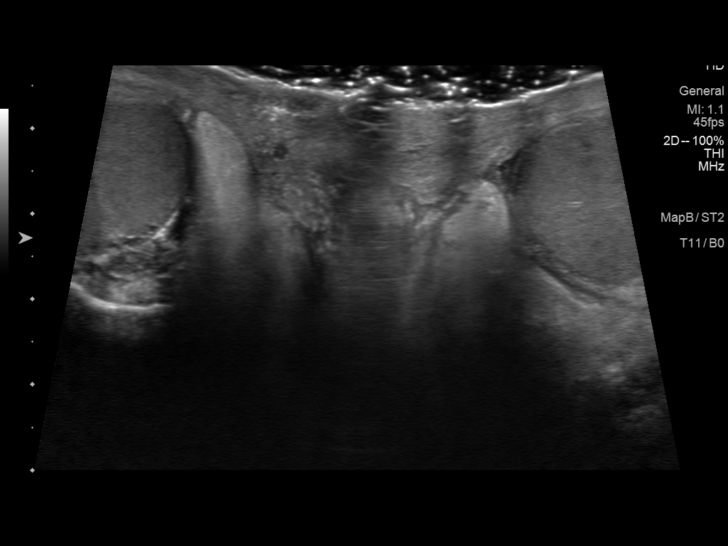
[im 4/38]
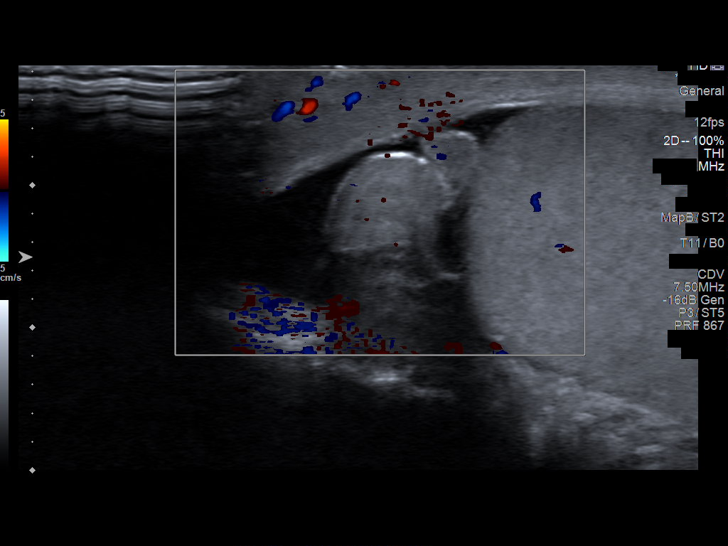
[im 7/38]
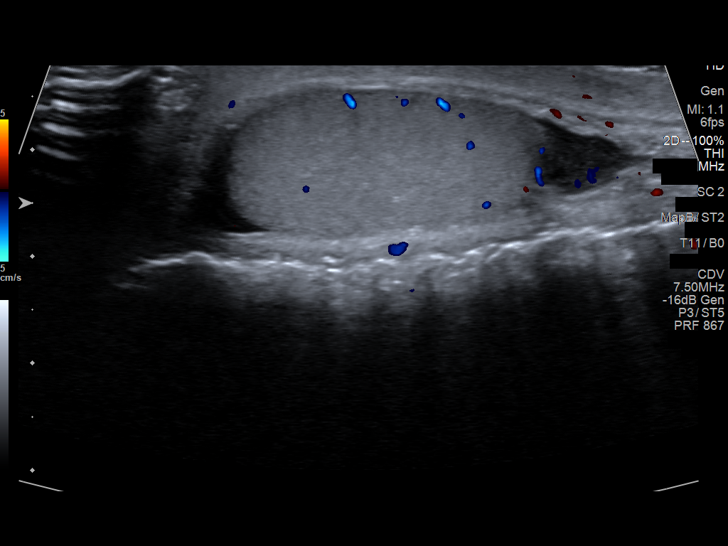
[im 10/38]
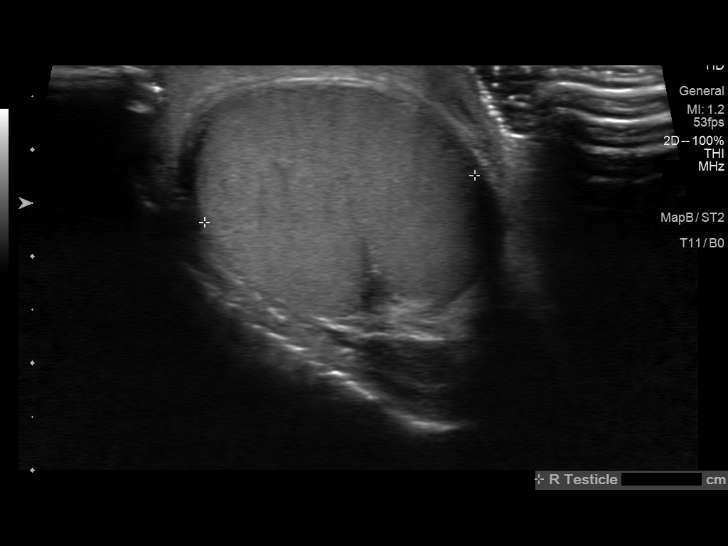
[im 13/38]
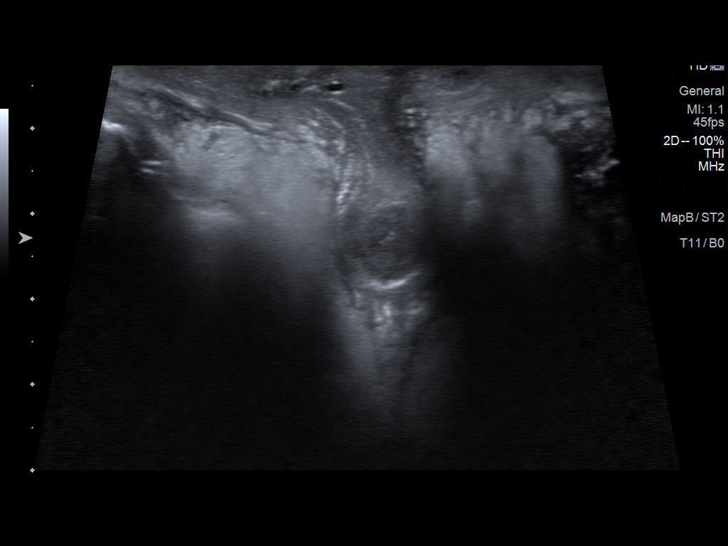
[im 14/38]
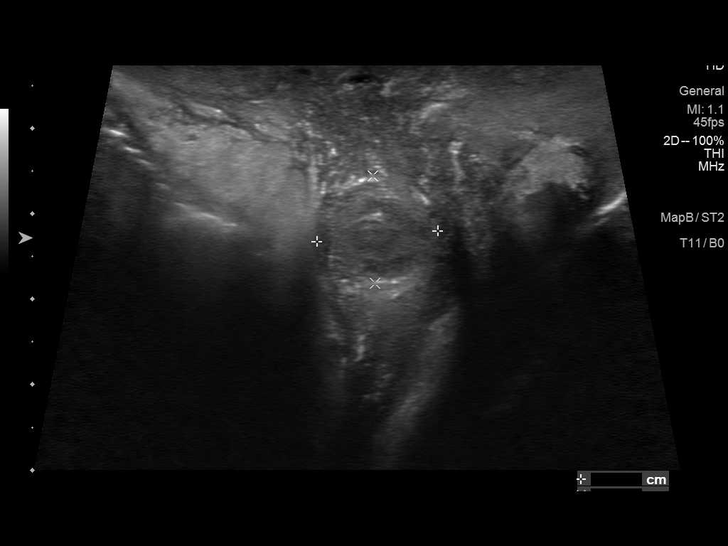
[im 17/38]
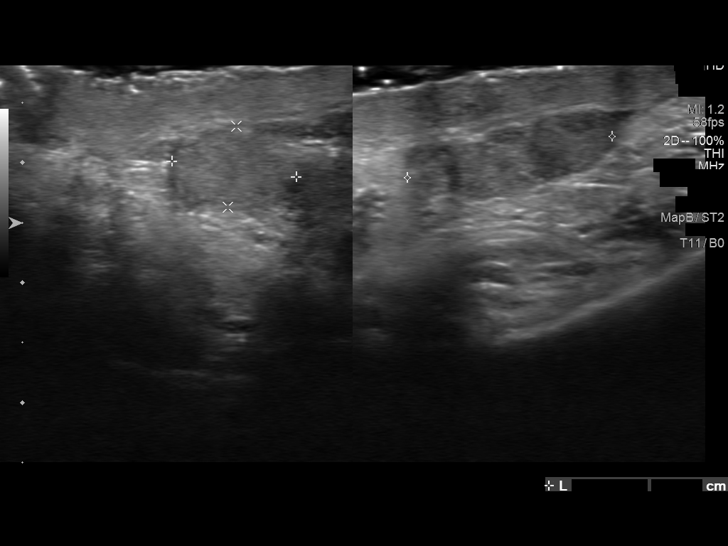
[im 21/38]
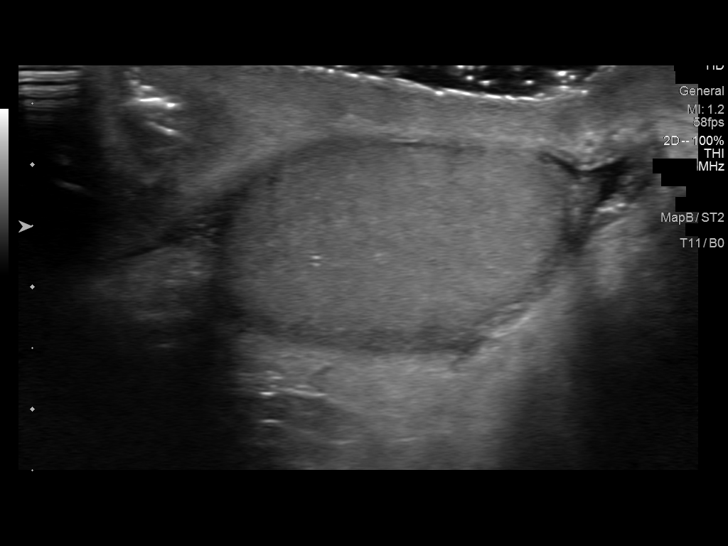
[im 24/38]
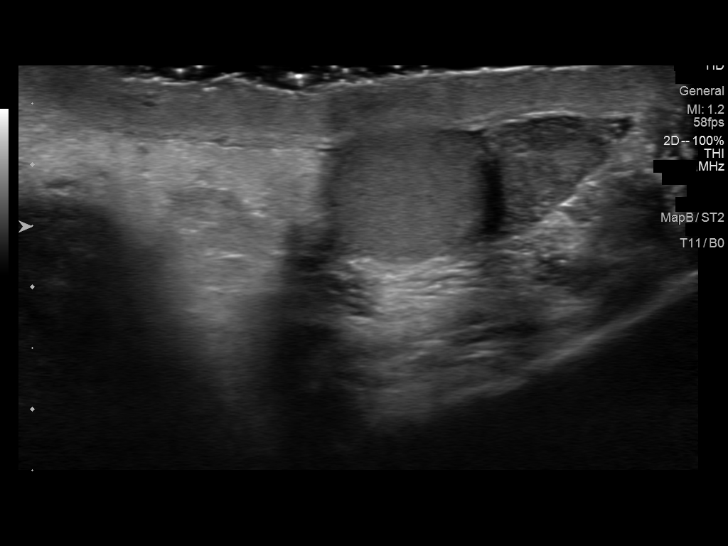
[im 25/38]
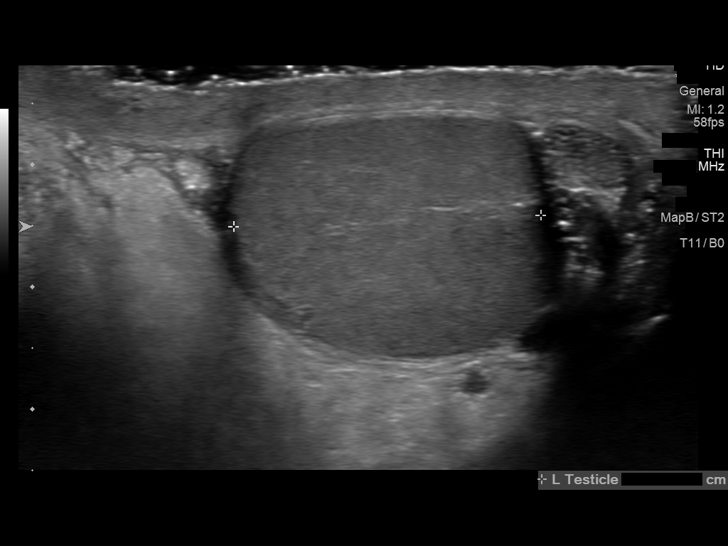
[im 28/38]
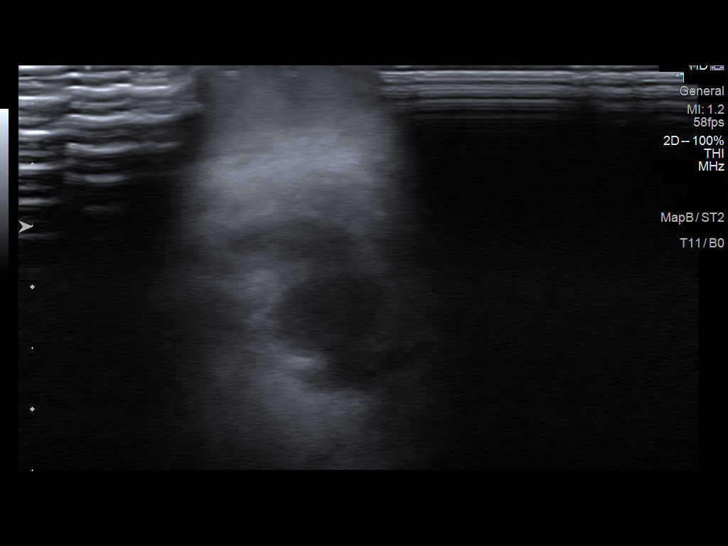
[im 31/38]
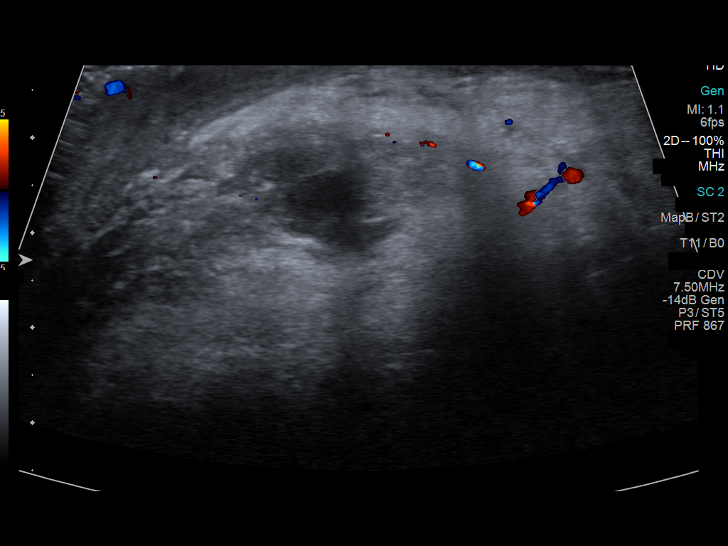
[im 34/38]
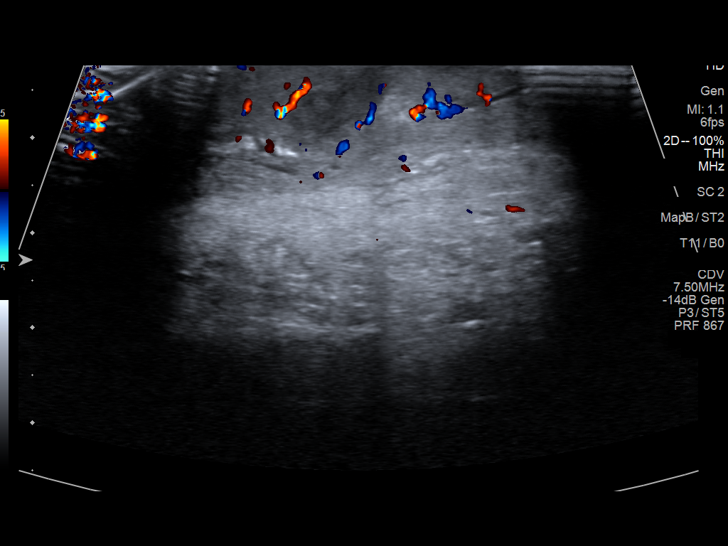
[im 38/38]
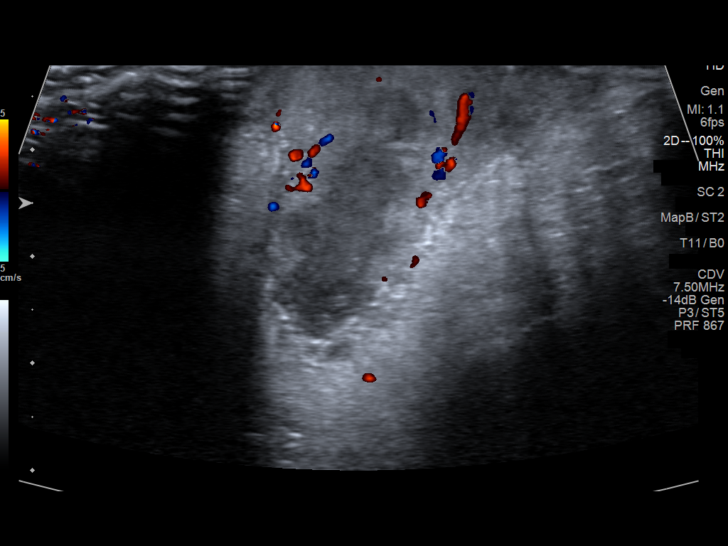

[14 of 25 positions shown; findings below may reference images not displayed]

FINDINGS: Right testicle

Measurements: 2.0 x 2.6 x 4.3 cm. No mass or microlithiasis
visualized.

Left testicle

Measurements: 2.1 x 2.5 x 4.3 cm. No mass or microlithiasis
visualized.

Right epididymis:  Normal in size and appearance.

Left epididymis:  Normal in size and appearance.

Hydrocele:  None visualized.

Varicocele:  None visualized.

In the posterior mid scrotum there is focal skin thickening,
hyperemic on color Doppler. There is an adjacent 1.4 x 2.7 by 1.4 cm
hypoechoic collection which likely represents a small posterior
midline scrotal abscess.
IMPRESSION: 1. Normal testes.
2. Posterior scrotal skin thickening and focal collection, likely an
abscess measuring 1.4 x 1.4 x 2.7 cm.

## 2015-12-04 ENCOUNTER — Emergency Department (HOSPITAL_BASED_OUTPATIENT_CLINIC_OR_DEPARTMENT_OTHER)
Admission: EM | Admit: 2015-12-04 | Discharge: 2015-12-04 | Disposition: A | Discharge status: Home / Self Care | Payer: Managed Care, Other (non HMO) | Attending: Emergency Medicine | Admitting: Emergency Medicine

## 2015-12-04 ENCOUNTER — Encounter (HOSPITAL_BASED_OUTPATIENT_CLINIC_OR_DEPARTMENT_OTHER): Payer: Self-pay | Admitting: Emergency Medicine

## 2015-12-04 DIAGNOSIS — G8929 Other chronic pain: Secondary | ICD-10-CM | POA: Insufficient documentation

## 2015-12-04 DIAGNOSIS — G43909 Migraine, unspecified, not intractable, without status migrainosus: Secondary | ICD-10-CM | POA: Diagnosis not present

## 2015-12-04 DIAGNOSIS — J0110 Acute frontal sinusitis, unspecified: Secondary | ICD-10-CM | POA: Diagnosis not present

## 2015-12-04 DIAGNOSIS — Z79899 Other long term (current) drug therapy: Secondary | ICD-10-CM | POA: Insufficient documentation

## 2015-12-04 DIAGNOSIS — Z792 Long term (current) use of antibiotics: Secondary | ICD-10-CM | POA: Diagnosis not present

## 2015-12-04 DIAGNOSIS — R51 Headache: Secondary | ICD-10-CM | POA: Diagnosis present

## 2015-12-04 HISTORY — DX: Other chronic pain: G89.29

## 2015-12-04 HISTORY — DX: Headache: R51

## 2015-12-04 MED ORDER — DIPHENHYDRAMINE HCL 50 MG/ML IJ SOLN
25.0000 mg | Freq: Once | INTRAMUSCULAR | Status: AC
Start: 2015-12-04 — End: 2015-12-04
  Administered 2015-12-04: 25 mg via INTRAVENOUS
  Filled 2015-12-04: qty 1

## 2015-12-04 MED ORDER — MOMETASONE FUROATE 50 MCG/ACT NA SUSP: 2.0000 | Freq: Every day | NASAL | Status: AC

## 2015-12-04 MED ORDER — METOCLOPRAMIDE HCL 5 MG/ML IJ SOLN
10.0000 mg | Freq: Once | INTRAMUSCULAR | Status: AC
  Administered 2015-12-04: 10 mg via INTRAVENOUS
  Filled 2015-12-04: qty 2

## 2015-12-04 MED ORDER — METHYLPREDNISOLONE SODIUM SUCC 125 MG IJ SOLR
125.0000 mg | Freq: Once | INTRAMUSCULAR | Status: AC
  Administered 2015-12-04: 125 mg via INTRAVENOUS
  Filled 2015-12-04: qty 2

## 2015-12-04 MED ORDER — LORATADINE 10 MG PO TABS
10.0000 mg | ORAL_TABLET | Freq: Once | ORAL | Status: AC
  Administered 2015-12-04: 10 mg via ORAL
  Filled 2015-12-04: qty 1

## 2015-12-04 MED ORDER — METOCLOPRAMIDE HCL 10 MG PO TABS: 10.0000 mg | ORAL_TABLET | Freq: Four times a day (QID) | ORAL | Status: AC | PRN

## 2015-12-04 MED ORDER — OXYMETAZOLINE HCL 0.05 % NA SOLN: 2.0000 | Freq: Two times a day (BID) | NASAL | Status: AC

## 2015-12-04 MED ORDER — KETOROLAC TROMETHAMINE 30 MG/ML IJ SOLN
30.0000 mg | Freq: Once | INTRAMUSCULAR | Status: AC
  Administered 2015-12-04: 30 mg via INTRAVENOUS
  Filled 2015-12-04: qty 1

## 2015-12-04 MED ORDER — LORATADINE 10 MG PO TABS: 10.0000 mg | ORAL_TABLET | Freq: Every day | ORAL | Status: AC

## 2015-12-04 MED ORDER — SODIUM CHLORIDE 0.9 % IV BOLUS (SEPSIS)
1000.0000 mL | Freq: Once | INTRAVENOUS | Status: AC
Start: 2015-12-04 — End: 2015-12-04
  Administered 2015-12-04: 1000 mL via INTRAVENOUS

## 2015-12-04 NOTE — Discharge Instructions (Signed)
Migraine Headache °A migraine headache is an intense, throbbing pain on one or both sides of your head. A migraine can last for 30 minutes to several hours. °CAUSES  °The exact cause of a migraine headache is not always known. However, a migraine may be caused when nerves in the brain become irritated and release chemicals that cause inflammation. This causes pain. °Certain things may also trigger migraines, such as: °· Alcohol. °· Smoking. °· Stress. °· Menstruation. °· Aged cheeses. °· Foods or drinks that contain nitrates, glutamate, aspartame, or tyramine. °· Lack of sleep. °· Chocolate. °· Caffeine. °· Hunger. °· Physical exertion. °· Fatigue. °· Medicines used to treat chest pain (nitroglycerine), birth control pills, estrogen, and some blood pressure medicines. °SIGNS AND SYMPTOMS °· Pain on one or both sides of your head. °· Pulsating or throbbing pain. °· Severe pain that prevents daily activities. °· Pain that is aggravated by any physical activity. °· Nausea, vomiting, or both. °· Dizziness. °· Pain with exposure to bright lights, loud noises, or activity. °· General sensitivity to bright lights, loud noises, or smells. °Before you get a migraine, you may get warning signs that a migraine is coming (aura). An aura may include: °· Seeing flashing lights. °· Seeing bright spots, halos, or zigzag lines. °· Having tunnel vision or blurred vision. °· Having feelings of numbness or tingling. °· Having trouble talking. °· Having muscle weakness. °DIAGNOSIS  °A migraine headache is often diagnosed based on: °· Symptoms. °· Physical exam. °· A CT scan or MRI of your head. These imaging tests cannot diagnose migraines, but they can help rule out other causes of headaches. °TREATMENT °Medicines may be given for pain and nausea. Medicines can also be given to help prevent recurrent migraines.  °HOME CARE INSTRUCTIONS °· Only take over-the-counter or prescription medicines for pain or discomfort as directed by your  health care provider. The use of long-term narcotics is not recommended. °· Lie down in a dark, quiet room when you have a migraine. °· Keep a journal to find out what may trigger your migraine headaches. For example, write down: °· What you eat and drink. °· How much sleep you get. °· Any change to your diet or medicines. °· Limit alcohol consumption. °· Quit smoking if you smoke. °· Get 7-9 hours of sleep, or as recommended by your health care provider. °· Limit stress. °· Keep lights dim if bright lights bother you and make your migraines worse. °SEEK IMMEDIATE MEDICAL CARE IF:  °· Your migraine becomes severe. °· You have a fever. °· You have a stiff neck. °· You have vision loss. °· You have muscular weakness or loss of muscle control. °· You start losing your balance or have trouble walking. °· You feel faint or pass out. °· You have severe symptoms that are different from your first symptoms. °MAKE SURE YOU:  °· Understand these instructions. °· Will watch your condition. °· Will get help right away if you are not doing well or get worse. °  °This information is not intended to replace advice given to you by your health care provider. Make sure you discuss any questions you have with your health care provider. °  °Document Released: 10/29/2005 Document Revised: 11/19/2014 Document Reviewed: 07/06/2013 °Elsevier Interactive Patient Education ©2016 [employer] ° °Sinusitis, Adult °Sinusitis is redness, soreness, and inflammation of the paranasal sinuses. Paranasal sinuses are air pockets within the bones of your face. They are located beneath your eyes, in the middle of your   forehead, and above your eyes. In healthy paranasal sinuses, mucus is able to drain out, and air is able to circulate through them by way of your nose. However, when your paranasal sinuses are inflamed, mucus and air can become trapped. This can allow bacteria and other germs to grow and cause infection. °Sinusitis can develop quickly and  last only a short time (acute) or continue over a long period (chronic). Sinusitis that lasts for more than 12 weeks is considered chronic. °CAUSES °Causes of sinusitis include: °· Allergies. °· Structural abnormalities, such as displacement of the cartilage that separates your nostrils (deviated septum), which can decrease the air flow through your nose and sinuses and affect sinus drainage. °· Functional abnormalities, such as when the small hairs (cilia) that line your sinuses and help remove mucus do not work properly or are not present. °SIGNS AND SYMPTOMS °Symptoms of acute and chronic sinusitis are the same. The primary symptoms are pain and pressure around the affected sinuses. Other symptoms include: °· Upper toothache. °· Earache. °· Headache. °· Bad breath. °· Decreased sense of smell and taste. °· A cough, which worsens when you are lying flat. °· Fatigue. °· Fever. °· Thick drainage from your nose, which often is green and may contain pus (purulent). °· Swelling and warmth over the affected sinuses. °DIAGNOSIS °Your health care provider will perform a physical exam. During your exam, your health care provider may perform any of the following to help determine if you have acute sinusitis or chronic sinusitis: °· Look in your nose for signs of abnormal growths in your nostrils (nasal polyps). °· Tap over the affected sinus to check for signs of infection. °· View the inside of your sinuses using an imaging device that has a light attached (endoscope). °If your health care provider suspects that you have chronic sinusitis, one or more of the following tests may be recommended: °· Allergy tests. °· Nasal culture. A sample of mucus is taken from your nose, sent to a lab, and screened for bacteria. °· Nasal cytology. A sample of mucus is taken from your nose and examined by your health care provider to determine if your sinusitis is related to an allergy. °TREATMENT °Most cases of acute sinusitis are related  to a viral infection and will resolve on their own within 10 days. Sometimes, medicines are prescribed to help relieve symptoms of both acute and chronic sinusitis. These may include pain medicines, decongestants, nasal steroid sprays, or saline sprays. °However, for sinusitis related to a bacterial infection, your health care provider will prescribe antibiotic medicines. These are medicines that will help kill the bacteria causing the infection. °Rarely, sinusitis is caused by a fungal infection. In these cases, your health care provider will prescribe antifungal medicine. °For some cases of chronic sinusitis, surgery is needed. Generally, these are cases in which sinusitis recurs more than 3 times per year, despite other treatments. °HOME CARE INSTRUCTIONS °· Drink plenty of water. Water helps thin the mucus so your sinuses can drain more easily. °· Use a humidifier. °· Inhale steam 3-4 times a day (for example, sit in the bathroom with the shower running). °· Apply a warm, moist washcloth to your face 3-4 times a day, or as directed by your health care provider. °· Use saline nasal sprays to help moisten and clean your sinuses. °· Take medicines only as directed by your health care provider. °· If you were prescribed either an antibiotic or antifungal medicine, finish it all even if you   start to feel better. °SEEK IMMEDIATE MEDICAL CARE IF: °· You have increasing pain or severe headaches. °· You have nausea, vomiting, or drowsiness. °· You have swelling around your face. °· You have vision problems. °· You have a stiff neck. °· You have difficulty breathing. °  °This information is not intended to replace advice given to you by your health care provider. Make sure you discuss any questions you have with your health care provider. °  °Document Released: 10/29/2005 Document Revised: 11/19/2014 Document Reviewed: 11/13/2011 °Elsevier Interactive Patient Education ©2016 Elsevier Inc. ° °

## 2015-12-04 NOTE — ED Provider Notes (Signed)
CSN: [id number]     Arrival date & time 12/04/15  1859 History  By signing my name below, I, Jeffrey Vaughn, attest that this documentation has been prepared under the direction and in the presence of Jeffrey Racer, MD. Electronically Signed: Budd Vaughn, ED Scribe. 12/04/2015. 8:33 PM.     Chief Complaint  Patient presents with  . Headache   The history is provided by the patient. No language interpreter was used.   HPI Comments: Jeffrey Vaughn is a 35 y.o. male with a PMHx of chronic headaches who presents to the Emergency Department complaining of a worsening, pulsating, right frontal and temporal migraine headache onset 3 days ago. He reports associated photophobia, sensitivity to noise and smells, nausea, congestion, and rhinorrhea. He notes a PMHx of the same, which he is usually able to resolve with sleep, caffeine, and goody powder. He has taken prescription medication for this in the past with sufficient relief. He notes he has episodes of migraine HA anywhere from 2x/week to 2x/month. He has not seen a neurologist for this. Pt denies abdominal pain.   Past Medical History  Diagnosis Date  . Chronic headaches    Past Surgical History  Procedure Laterality Date  . Back surgery    . Anterior cruciate ligament repair     History reviewed. No pertinent family history. Social History  Substance Use Topics  . Smoking status: Never Smoker   . Smokeless tobacco: None  . Alcohol Use: No    Review of Systems  Constitutional: Negative for fever and chills.  HENT: Positive for congestion, rhinorrhea and sinus pressure.   Eyes: Positive for photophobia.  Respiratory: Negative for cough and shortness of breath.   Cardiovascular: Negative for chest pain, palpitations and leg swelling.  Gastrointestinal: Positive for nausea. Negative for vomiting and abdominal pain.  Musculoskeletal: Negative for myalgias, back pain, neck pain and neck stiffness.  Skin: Negative for rash and  wound.  Neurological: Positive for headaches. Negative for dizziness, weakness, light-headedness and numbness.  All other systems reviewed and are negative.   Allergies  Review of patient's allergies indicates no known allergies.  Home Medications   Prior to Admission medications   Medication Sig Start Date End Date Taking? Authorizing Provider  cephALEXin (KEFLEX) 500 MG capsule Take 1 capsule (500 mg total) by mouth 4 (four) times daily. 05/18/15   Jeffrey Racer, MD  HYDROcodone-acetaminophen (NORCO) 5-325 MG per tablet Take 1-2 tablets by mouth every 4 (four) hours as needed for moderate pain or severe pain. 05/18/15   Jeffrey Racer, MD  ibuprofen (ADVIL,MOTRIN) 600 MG tablet Take 1 tablet (600 mg total) by mouth every 6 (six) hours as needed. 05/18/15   Jeffrey Racer, MD  loratadine (CLARITIN) 10 MG tablet Take 1 tablet (10 mg total) by mouth daily. 12/04/15   Jeffrey Racer, MD  metoCLOPramide (REGLAN) 10 MG tablet Take 1 tablet (10 mg total) by mouth every 6 (six) hours as needed for nausea or vomiting (headache). 12/04/15   Jeffrey Racer, MD  mometasone (NASONEX) 50 MCG/ACT nasal spray Place 2 sprays into the nose daily. 12/04/15   Jeffrey Racer, MD  ondansetron (ZOFRAN ODT) 4 MG disintegrating tablet Take 1 tablet (4 mg total) by mouth every 8 (eight) hours as needed for nausea or vomiting. 05/19/15   Jeffrey N Ward, DO  oxymetazoline (AFRIN NASAL SPRAY) 0.05 % nasal spray Place 2 sprays into both nostrils 2 (two) times daily. Use no more than 3 days 12/04/15   Jeffrey Racer,  MD   BP 139/87 mmHg  Pulse 80  Temp(Src) 98.5 F (36.9 C) (Oral)  Resp 16  Ht  (1.854 m)  Wt 230 lb (104.327 kg)  BMI 30.35 kg/m2  SpO2 99% Physical Exam  Constitutional: He is oriented to person, place, and time. He appears well-developed and well-nourished. No distress.  HENT:  Head: Normocephalic and atraumatic.  Mouth/Throat: Oropharynx is clear and moist.  Nasal mucosal edema. Patient has  right frontal sinus tenderness to percussion.  Eyes: EOM are normal. Pupils are equal, round, and reactive to light.  Neck: Normal range of motion. Neck supple.  The posterior midline cervical tenderness to palpation. No meningismus. No anterior cervical lymphadenopathy.  Cardiovascular: Normal rate and regular rhythm.  Exam reveals no gallop and no friction rub.   No murmur heard. Pulmonary/Chest: Effort normal and breath sounds normal. No respiratory distress. He has no wheezes. He has no rales. He exhibits no tenderness.  Abdominal: Soft. Bowel sounds are normal. He exhibits no distension and no mass. There is no tenderness. There is no rebound and no guarding.  Musculoskeletal: Normal range of motion. He exhibits no edema or tenderness.  Neurological: He is alert and oriented to person, place, and time.  Patient is alert and oriented x3 with clear, goal oriented speech. Patient has 5/5 motor in all extremities. Sensation is intact to light touch. Bilateral finger-to-nose is normal with no signs of dysmetria. Patient has a normal gait and walks without assistance.  Skin: Skin is warm and dry. No rash noted. No erythema.  Psychiatric: He has a normal mood and affect. His behavior is normal.  Nursing note and vitals reviewed.   ED Course  Procedures  DIAGNOSTIC STUDIES: Oxygen Saturation is 100% on RA, normal by my interpretation.    COORDINATION OF CARE: 8:30 PM - Discussed probable sinusitis triggered migraine. Discussed plans to treat for migraine and sinusistis. Pt advised of plan for treatment and pt agrees.  Labs Review Labs Reviewed - No data to display  Imaging Review No results found. I have personally reviewed and evaluated these images and lab results as part of my medical decision-making.   EKG Interpretation None      MDM   Final diagnoses:  Migraine without status migrainosus, not intractable, unspecified migraine type  Acute frontal sinusitis, recurrence not  specified   I personally performed the services described in this documentation, which was scribed in my presence. The recorded information has been reviewed and is accurate.   Headache is resolved. Maintains a normal neurologic exam. Return precautions given.  Jeffrey Racer, MD 12/04/15 2152

## 2015-12-04 NOTE — ED Notes (Signed)
Patient states that he has had a MHA x 3 days. Starting to get nauseated now

## 2015-12-04 NOTE — ED Notes (Signed)
Dr. Yelverton into room
